# Patient Record
Sex: Female | Born: 1965 | Hispanic: Yes | Marital: Single | Smoking: Never smoker
Health system: Western US, Academic
[De-identification: ages and names within clinical notes are randomized; demographics above are authoritative.]

## PROBLEM LIST (undated history)

## (undated) DIAGNOSIS — I10 Essential (primary) hypertension: Secondary | ICD-10-CM

## (undated) MED ORDER — SILVER SULFADIAZINE 1 % EX CREA
1.00 | TOPICAL_CREAM | Freq: Once | CUTANEOUS | Status: AC | PRN
Start: 2013-11-30 — End: ?

---

## 2008-07-11 ENCOUNTER — Emergency Department: Payer: Self-pay

## 2008-07-11 ENCOUNTER — Other Ambulatory Visit (INDEPENDENT_AMBULATORY_CARE_PROVIDER_SITE_OTHER): Payer: Self-pay

## 2008-07-11 ENCOUNTER — Emergency Department (HOSPITAL_BASED_OUTPATIENT_CLINIC_OR_DEPARTMENT_OTHER): Admitting: Emergency Medicine

## 2008-07-11 ENCOUNTER — Other Ambulatory Visit (HOSPITAL_BASED_OUTPATIENT_CLINIC_OR_DEPARTMENT_OTHER): Payer: Self-pay

## 2008-07-12 LAB — ECG, COMPLETE (HC/~~LOC~~/ENCINITAS)
PR INTERVAL: 134 ms
QRS INTERVAL/DURATION: 76 ms
QT: 376 ms
QTc (Bazett): 457 ms
R AXIS: -1 degrees
VENTRICULAR RATE: 89 {beats}/min

## 2008-07-12 NOTE — ED Notes (Addendum)
 ============================== ADMIT SUMMARY ==============================    RECEIVING NURSE -   ED NURSE -     +------------------------------- ALLERGIES -------------------------------+   No known drug allergies (07/11/2008);     +-------------------------- ADMITTING DIAGNOSIS --------------------------+       +--------------------------- ADMITTING SERVICE ---------------------------+  ADMISSION SERVICE -   LEVEL OF CARE -   ATTENDING -   RESIDENT -     +------------------------ MOST RECENT VITAL SIGNS ------------------------+  BP - 128/80 PULSE - 72   RESPIRATIONS - 16 O2 SAT - 99   TEMPERATURE - 98.0 MODE - Oral   GCS TOTAL -   PAIN - PAIN QUALITY -   PAIN LOCATION -   DATE/TIME - 07/12/2008 0005    +-------------------------------- FLUIDS ---------------------------------+  DATE TIME IV FLUID L/R LOCATION SIZE HUNG ABSORBED  ---------------------------------------------------------------------------    12/21 1421 Normal Saline Left Anticubital 20 1000 ml 0 ml  12/21 1645 Normal Saline Left Anticubital 20 1000 ml 1000 ml  12/21 1645 NS with Med Left Anticubital 20 50 ml 0 ml  12/21 1714 NS with Med Left Anticubital 20 0 ml 50 ml  12/21 1853 Normal Saline Left Anticubital 20 0 ml 1000 ml   TOTAL IV: 2050 ml    TOTAL OUTPUT: 0 ml TOTAL PO: 200 ml    +------------------------------ MEDICATIONS ------------------------------+  DATE TIME MEDICATION VERIFYING RN RN INIT  ---------------------------------------------------------------------------    12/21 1407 Ibuprofen 600 MG PO ARC  12/21 1407 Ondansetron HCl 4 MG PO ARC  12/21 1501 Hydrocodone-Acetaminophen 1 Tabs ARC   PO-(TAB)  12/21 1646 Sodium Chloride 1000 Milliliters ARC   IV-(BOLUS)  12/21 1646 Morphine Sulfate 8 MG IV 50cc d5w ARC  12/21 1900 Ondansetron HCl 4 MG IV ARC  12/21 1900 Sodium Chloride 1000 Milliliters ARC   IV-(BOLUS)  12/21 2120 Oxycodone-Acetaminophen 2 Caps PO WBP    +------------------------------- LABS DONE  -------------------------------+  ACT- MD MD RN AP +INITIALS+  IVE DATE TIME TIME TIME TREATMENT ORDERS MD RN AP   ---------------------------------------------------------------------------     12/21 2207 2211 INFLUENZA A/B RAPID PANEL, acs [facility]    INFLUENZA-RESP VIRUS RAPID CULTURE   Collect New Specimen   Specimen Type: Nasal   Swab/Rhinoprobe    EKG DONE - YES    +---------------------------- PROCEDURE NOTES ----------------------------+      +------------------------ CURRENT MEDICATION --------------------------+     Tylenol for Continuous  +------------------------ OTHER NURSING PROCEDURES -----------------------+     +--------------------------- PSYCHOSOCIAL NEEDS --------------------------+  +------------------------- BARRIERS TO LEARNING --------------------------+    ASSESSMENT- Assessment Done with Findings of:      BARRIERS-    No Barriers  SUPPORT PERSON-      SPECIAL CONSIDERATIONS-      ============================== TRIAGE RECORD ==============================    CHIEF COMPLAINT- Cough    TIME OF ONSET- 2 weeks : +-STANDING-+ +--SEATED--+  TRIAGE CATEGORY- 2 : BP PULSE BP PULSE   ROOM- 4A : N/A/N/A N/A 149/92 90   MODE OF ARRIVAL- Car :   IN CUSTODY- No : TEMP MODE O2SAT RESP LMP   PRIVATE MD- N/A : 98.3 Oral 100 18 07/03/08       :   WORK RELATED INJURY- No : +--GCS--+ +--PUPILS--+  RETURN IN 72 HOURS- None : E V M TOT L R RESPONSE  TRIAGE NURSE- Pat Doyle : 4 5 6 15  X X N/A     IS THIS VISIT RELATED TO ASSAULT OR DOMESTIC VIOLENCE- No   PAIN TYPE- V NOW- 6 TOLERABLE AT- 4 QUALITY- Intermittent  PAIN LOCATION- Head 6/10;chest 5/10. RADIATES TO- N/A      LATEX ALLERGY FORM- N/A LATEX ALLERGY- N/A TETANUS- N/A   IMMUNIZATION- N/A PED HEIGHT- N/A WEIGHT- N/A KG  ADDITIONAL FORMS- N/A  +------------------------- CARE PRIOR TO ARRIVAL -------------------------+   Tylenol  +------------------------------- ALLERGIES -------------------------------+    MEDICATION ALLERGY  REACTION  ---------------------------------------------------------------------------    N/A No known drug allergies Unknown Rea    +------------------------------ MEDICATIONS ------------------------------+    MEDICATION NAME DOSAGE FREQUENCY  ---------------------------------------------------------------------------    Tylenol N/A N/A     +-------------------------- PAST MEDICAL HISTORY -------------------------+     +---------------------------- CURRENT HISTORY ----------------------------+   Pt. c/o cough,sore throat for 2 weeks. CP and HA since last night.since   last night. Taking po fluids.    =========================== REASSESSMENT VITALS ===========================   R T M ET    E E O CO2 PU-    S M D O2 ET TY- PIL +---GCS----+   DATE TIME BP HR P P E SAT CO2 PE L R E V M TOT POSITION      ---------------------------------------------------------------------------    12/21 1048 149/92 90 18 98.3 O 100 4 5 6 15  Seated   12/21 1048 / Standing  12/21 1427 118/79 87 16 100 Lying   12/21 1429 139/99 85 16 100 Seated   12/21 1431 147/109 91 16 100 Standing  12/21 1647 140/72 79 14 99   12/21 1747 /   12/21 1804 122/86 80 16 99 Lying   12/21 1914 / 97.6 O   12/21 2000 / 99.0 C   12/21 2239 129/84 77 18 99 Lying   12/22 0005 128/80 72 16 98.0 O 99 Lying      +-----------------PAIN-----------------+   T I    Y N T N    P O O I   DATE TIME E W L LOCATION QUALITY RADIATES COMMENT T   ---------------------------------------------------------------------------    12/21 1048 V 6 4 Head 6/10; Intermittent N/A Triage PLD  12/21 1048 Triage PLD  12/21 1427 Orthostat JEC  12/21 1429 Orthostat JEC  12/21 1431 Orthostat JEC  12/21 1647 V 8 4 head Constant none ARC  12/21 1747 V 8 4 head Constant none ARC  12/21 1804 JEC  12/21 1914 JEC  12/21 2000 WBP  12/21 2239 x   12/22 0005 WBP    ============================= NURSE DISCHARGE =============================    DISCHARGE NURSE- Wilma Pacal   DISPOSITION- Discharged from ED  WITH- Family   ACCOMPANIED BY- N/A   EQUIP W/TRANSPORT-    N/A   TIME OF DISPOSITION- 07/12/2008 0010 LEFT ED VIA- [facility]   TRANSFERRED TO- N/A REASON- N/A   ADMITTED TO- N/A ROOM- N/A   NURSE REPORT TO- N/A REPORT TIME- X N/A   BELONGINGS- N/A ENVELOPE NUMBER- N/A   CONDITION ON DISCHARGE- Stable   AFTERCARE PROVIDED WITH- Written and Verbal   WHAT AFTERCARE INSTRUCTIONS WERE GIVEN AND REVIEWED WITH PATIENT  AND/OR FAMILY?- (see EPIC instructions)   Viral Syndrome (cold, URI, upper respiratory infection); meds, pmd ff up  IN WHAT LANGUAGE WERE THESE GIVEN?- English OTHER: N/A   TRANSLATED BY- N/A OTHER: N/A   GCS- E: 4 V: 5 M: 6 TOTAL: 15   PAIN LEVEL UPON DISPOSITION- 2 OUT OF 10  WHAT MEDS WERE PROVIDED FROM DISCHARGE PYXIS?-    vicodin   RX TO BE FILLED FOR- Robitussin AC . motrin; Motrin Please review with MD.;     DID THE PATIENT OR RESPONSIBLE  CARE PROVIDER UNDERSTAND THE FOLLOW UP  RECOMMENDATION?- Yes DISPOSITION BY- MD   NURSING LEVEL- 3. ED Stay with Multiple Interactions     +------------------------------- RESTRAINTS ------------------------------+  ALTERNATIVE ATTEMPTS:    -------------------------- RESTRAINT ASSESSMENTS --------------------------  ============================== POINT OF CARE ==============================    OCCULT BLOOD STOOL RESULTS   Norm results neg.  DATE TIME RESULTS DONE BY CONTROL POSTIVE CONTROL NEGATIVE     URINE PREGNANCY TEST   Norm results for non pregnant females neg.  DATE TIME RESULTS DONE BY CONTROL POSTIVE   12/21 1221 Neg ARC Yes     URINE DIP   Norm results - All neg. with pH 5.0 to 8.0 and urobili 0.2 to 1.0   LEUKO NI- PRO- GLU- URO-   DATE TIME CYTE TRITE PH TEIN COSE KETONES BILI BILI BLOOD BY   12/21 1221 neg neg 7.0 neg neg neg .2 neg neg ARC    FINGER STICK GLUCOSE   Norm results 65 to 110 mg/dl  DATE TIME RESULTS DONE BY     FINGER STICK HEMOGLOBIN   Norm results adult female 60 to 17 gm/dl  Norm results adult female 19 to 16 gm/dl  DATE TIME RESULTS DONE  BY     ============================== MD NOTES H&P ===============================    TIME OF NOTE WRITTEN- N/A   CHIEF COMPLAINT- Cough     HISTORY OF PRESENT ILLNESS  12/21 1624 Kermit Balo, MD     42 yo f c/o 1 wk h/o procductive cough that recenlty got

## 2008-07-12 NOTE — ED Notes (Addendum)
==============================   ATTENDING NOTE =============================    12/22 0007 Brandy Browns, MD Attending     pt seen and examined hx pe management discussed with   resident   42 yr old female initially signed out to me but later since   sx not improving examined pt prev healthy presented with   nonproductive cough sore throat runny nose myalgias and   headache latter described as slowly worsening at worst 8/10   intensity not sudden onset no vomiting no photophobia    alert female well appearing   supple neck   perrl bilaterally   oroph clear   no tonsillar exudate   cta bilaterally   rrr no murmur   soft no ttp   no rash   oriented   cn 2-7 intact bilaterally   no pronator drift   nl ftn bilaterally   nl gait      cxry nad   ct brain nad no sinusitis      i/p   uri   -suspect influenza pt was txed symptomatically and will tx   with ostelmavir pt improved sign here with sx tx agree with   dc

## 2008-07-12 NOTE — ED Notes (Addendum)
 worse. +body aches, f/c, Ha, sore throat, sinus congestion.   +plueritic cp on right side, pain reproducable to palp.   +sick contact w son dx w flu. +n no emesis. +sob. no abd   pain. no neck stiffness. no LE edema, de appetite      PAST MEDICAL/SURGICAL HISTORY   N/A     FAMILY HISTORY- N/A   SOCIAL HISTORY-   SMOKING ALCOHOL ILLICIT DRUGS HOMELESS MARRIED EMPLOYED   NONE NONE NONE N/A S   UNEMPLOYED  OTHER- N/A   REVIEW OF SYSTEMS- N/A     PHYSICAL EXAM  12/21 1624 Kermit Balo, MD     Vital signs reviewed and noted to be WNL. afebrile   GEN: Pt is WDWN in NAD. A&O*3.    HEENT: perrl, eomi. mmm. OP mild erythema no exudates   CHEST: CTA B.    CARD: RRR no murmur.    ABD: + BS soft mild epigastic tenderness   EXT: No pedal edema, cap refill wnl.   SKIN: Warm, not cool or diaphoretic, no rashes   NEURO: Mentation and speech fluent/appropriate. nonfocal      IMPRESSION  12/21 1624 Kermit Balo, MD     42 yo f w flu sx, poss PNA vs influenza      MEDICAL DECISION MAKING  12/21 1624 Kermit Balo, MD     cxr, ivfs, pain control    CASE PRESENTED TO- N/A     ============================= PHYSICIAN NOTES =============================    12/21 1332 Kermit Balo, MD     CXR: NAD, no consoldation, PTX    12/21 1656 Marcelle Smiling, MD Attending     sx control & dc w/ tamiflu    12/21 1708 Chancy Milroy, MD     bromely signout-       41 yo prev healthy female, with uri, cough, cxr neg,   recieving IVF/morphine, reassess/eval, will write for   robitussin, tamiflu, and motrin for dc. Anticipate dc home   after reassess.    12/21 2259 Chancy Milroy, MD     pt w/ continued headache slow onset x 2 days, in setting of   flu symptoms/uri, pt's exam, history and physical, nml neuor   exam, headache frontal in nature, no photophobia or   meningismus, doubt meningitis or ICH, will check CT head   for obvious signs of sinusitis that would require abx    12/21 2348 Chancy Milroy, MD     reassessed, pt headache  improved, feeling better, CT head   neg rads prelim, will plan for dc home with script for   vicodin #15      ============================= PROCEDURE NOTES =============================      ================================ LAB NOTES ================================    12/21 1243 Kermit Balo, MD     Upreg Neg   Udip neg    12/21 2255 Chancy Milroy, MD     flu neg      ================================= IMAGES ==================================      ============================== MD DISCHARGE ===============================    DISCHARGE PHYSICIAN- Nicholle Bromley   CHIEF COMPLAINT- Cough CASE PRESENTED TO- Colleen Buono   CONDITION OF DISCHARGE- Stable   WAS THIS VISIT FOR A WORK RELATED ILLNESS OR INJURY- No   PRIMARY CARE PHYSICIAN- NO PCP PER PATIENT   HAS PCP BEEN CONTACTED- N/A H&P NOTE WAS DICTATED- No     +-------------------------- DISCHARGE DIAGNOSIS --------------------------+   786.2 COUGH     +------------------------- DISCHARGE INSTRUCTIONS ------------------------+    12/21 1648 Kermit Balo, MD  PHYSICIAN- Hiram Comber, MD Attending FOLLOW-UP(DAYS)- N/A    APPOINTMENT- N/A RETURN TO- N/A LANGUAGE- English    INSTRUCTIONS-   MEDICATIONS-   REFERRAL CLINICS-   [facility]    CC [facility] ([facility]    CC [facility] [address]    [facility] ([place]    CC [facility] ([place])    CC [facility] [address] [facility] ([place])    CC [facility] ([place])    CC [facility] ([place])    REFERRAL PHYSICIANS-   ADDITIONAL INSTRUCTIONS-   N/A       +----------------------- MEDICATION RECONCILIATION -----------------------+    ---------------------------- CURRENT MEDICATION ---------------------------  MEDICATION NAME DOSAGE FREQUENCY  ---------------------------------------------------------------------------    Tylenol N/A N/A          ---------------------------- STOPPED MEDICATION ---------------------------  MEDICATION NAME DOSAGE FREQUENCY  ---------------------------------------------------------------------------      ---------------------------- UPDATED MEDICATION ---------------------------  MEDICATION NAME DOSAGE FREQUENCY  ---------------------------------------------------------------------------      ----------------------------- ADDED MEDICATION ----------------------------  MEDICATION NAME DOSAGE FREQUENCY  ---------------------------------------------------------------------------    Robitussin AC Please review with MD. 10 ML Every 4 Hours   Motrin Please review with MD. 1 MG When Necessary      ============================= FOLLOW UP NOTES =============================

## 2008-07-12 NOTE — ED Notes (Addendum)
=================================   ORDERS ==================================    ACT- MD MD RN AP   IVE DATE TIME TIME TIME TREATMENT ORDERS MD RN   ---------------------------------------------------------------------------     12/21 1059 1103 CHEST PA & LATERAL, X-RAY NB ARC    Rad Indication: cough x 2wks   12/21 1100 1103 EKG NB ARC    12/21 1211 1214 POC Urine Dip NB ARC    12/21 1211 1214 POC UPT NB ARC    12/21 1355 1357 Ibuprofen 600 MG PO - Oral Please CJB ARC    review with MD.   12/21 1356 1357 Ondansetron HCl 4 MG PO - Oral CJB ARC    Please review with MD.   12/21 1401 1402 orthostats CJB ARC    12/21 1401 1402 po trial w/ 1L fluid please CJB ARC    12/21 1450 1452 Hydrocodone-Acetaminophen 1 Tabs NB ARC    PO-(TAB) Please review with MD.   12/21 1611 1649 Sodium Chloride 1000 Milliliters NB ARC    IV-(BOLUS)   12/21 1611 1649 Morphine Sulfate 8 MG IV - NB ARC    Intravenous   12/21 1849 1851 Ondansetron HCl 4 MG IV - acs ARC    Intravenous Please review with MD.   12/21 1858 1900 Sodium Chloride 1000 Milliliters acs ARC    IV-(BOLUS)   12/21 1858 1900 please repeat core temp acs ARC    12/21 2116 2130 Oxycodone-Acetaminophen 2 Caps PO - acs WBP    Oral Please review with MD.   12/21 2206 2210 CT HEAD W/O CONTRAST acs [facility]    Rad Indication: headache   12/21 2207 2211 INFLUENZA A/B RAPID PANEL, acs [facility]    INFLUENZA-RESP VIRUS RAPID CULTURE   Collect New Specimen   Specimen Type: Nasal   Swab/Rhinoprobe   12/21 2349 0006 Discontinue IV Line As Indicated acs WBP    12/21 2349 0006 Discharge acs WBP

## 2008-12-15 ENCOUNTER — Other Ambulatory Visit (INDEPENDENT_AMBULATORY_CARE_PROVIDER_SITE_OTHER): Payer: Self-pay | Admitting: Emergency Medicine

## 2008-12-15 ENCOUNTER — Emergency Department
Admit: 2008-12-15 | Discharge: 2008-12-18 | Disposition: A | Discharge status: Home Health | Payer: Self-pay | Attending: Emergency Medicine | Admitting: Emergency Medicine

## 2008-12-15 LAB — CBC WITH DIFF, BLOOD
Basophils: 0 % (ref 0–2)
Eosinophils: 1 % (ref 1–3)
Hct: 39.7 % (ref 36.0–46.0)
Hgb: 13.6 g/dL (ref 12.0–16.0)
Lymphocytes: 33 % (ref 20–40)
MCHC: 34.2 % (ref 32–37)
MCV: 88.2 um3 (ref 82.0–98.0)
Monocytes: 5 % (ref 1–10)
Plt Count: 344 10*3/uL (ref 130–400)
RBC: 4.49 10*6/uL (ref 4.00–5.00)
RDW: 11.8 % (ref 10–15)
WBC: 8.9 10*3/uL (ref 4.0–11.0)

## 2008-12-15 LAB — URINALYSIS
Glucose: NEGATIVE
Ketones: NEGATIVE
Specific Gravity: 1.022 (ref 1.002–1.030)
pH: 6 (ref 5.0–8.0)

## 2008-12-15 LAB — BASIC METABOLIC PANEL, BLOOD
BUN: 11 mg/dL (ref 6–20)
Calcium: 9.2 mg/dL (ref 8.6–10.5)
Chloride: 103 mmol/L (ref 98–107)
Creatinine: 0.6 mg/dL (ref 0.51–0.95)
GFR (African Amer.): >60 mL/min
GFR: >60 mL/min
Glucose: 94 mg/dL (ref ?–115)
Potassium: 3.5 mmol/L (ref 3.5–5.1)
Sodium: 140 mmol/L (ref 136–145)

## 2008-12-15 LAB — LIVER PANEL, BLOOD
ALT (SGPT): 13 U/L
AST (SGOT): 14 U/L
Albumin: 4.4 g/dL (ref 3.5–5.2)
Alkaline Phos: 68 U/L
Bilirubin, Dir: 0.1 mg/dL (ref <0.2)
Bilirubin, Tot: 0.3 mg/dL (ref <1.2)

## 2008-12-15 LAB — LIPASE, BLOOD: Lipase: 53 U/L (ref ?–60)

## 2008-12-16 NOTE — ED Notes (Signed)
 FAMILY HISTORY- nc   SOCIAL HISTORY-   SMOKING ALCOHOL ILLICIT DRUGS HOMELESS MARRIED EMPLOYED   N/A N/A N/A NO S   UNEMPLOYED  OTHER- nc   REVIEW OF SYSTEMS- All other systems reviewed and are negative.     PHYSICAL EXAM  05/27 2306 Brandy Lerner, MD     Vital signs reviewed and noted.    GEN: a&o in nad, tearful, anxious, non-toxic appearing.   HEAD: nc/at. perrl, eomi. mmm.    NECK: Supple, no LAD, No JVD. Nl thyroid   LUNGS: CTA B. No wheeze/rales/rhonchi   HEART: RRR, no m/r/g.    ABD: Soft, ND, NT   BACK: no CVA tenderness   EXT: No pedal edema   SKIN: Warm, dry, no rashes   NEURO: Mentation appropriate, CN II-XII intact. 5/5 strength   B/L U&LE. Sensation globally intact and symmetric. Gait   normal.    05/27 2333 Brandy Lerner, MD     PELVIC: Done w/ ED RN present. nml ext F genitalia. Speculum   nl closed os, no lacerations noted, Minimal white   discharge. Bimanual diffuse tenderness, nl sized uterus,   no masses palpated.      IMPRESSION  05/27 2306 Brandy Lerner, MD     [phone] f with stress at home, multiple complaints including   ha, back pain, lower abdomianl pain      MEDICAL DECISION MAKING  05/27 2306 Brandy Lerner, MD     will check labs, ua, pelvic. Do not feel imaging is needed   at this time given normal neuro exam. will give analgesia   and re assess.    CASE PRESENTED TO- N/A     ============================= PHYSICIAN NOTES =============================    05/28 0144 Pauletta Browns, MD Attending     pt with nausea vomiting after azithromycin will given   antiemetic plan on dc    05/28 0210 Pauletta Browns, MD Attending     pt feeling better will dc home      ============================= PROCEDURE NOTES =============================      ================================ LAB NOTES ================================    05/27 2114 Quin Hoop, MD Attending     ua negative    05/27 2149 Brandy Lerner, MD     no leukocytosis   no anemia    05/27 2205 Brandy Lerner, MD     bmp wnl   lfts  wnl    05/28 0127 Pauletta Browns, MD Attending     seen      ================================= IMAGES ==================================      ============================== MD DISCHARGE ===============================    DISCHARGE PHYSICIAN- Brandy Martin   CHIEF COMPLAINT- Back Pain CASE PRESENTED TO- Pauletta Browns   CONDITION OF DISCHARGE- Improving   WAS THIS VISIT FOR A WORK RELATED ILLNESS OR INJURY- No   PRIMARY CARE PHYSICIAN- NO PCP PER PATIENT   HAS PCP BEEN CONTACTED- N/A H&P NOTE WAS DICTATED- No     +-------------------------- DISCHARGE DIAGNOSIS --------------------------+   724.5 BACKACHE UNSPECIFIED     +------------------------- DISCHARGE INSTRUCTIONS ------------------------+    05/27 2346 Brandy Lerner, MD     PHYSICIAN- Pauletta Browns, MD Attending FOLLOW-UP(DAYS)- N/A    APPOINTMENT- N/A RETURN TO- N/A LANGUAGE- English    INSTRUCTIONS-   MEDICATIONS-   REFERRAL CLINICS-   [facility]    CC [facility] ([facility]    CC [facility] [address]    CC [facility]  CC [facility] ([place]    [facility]    [facility] ([place])    [facility]    CC [facility] [address] [facility] ([place]    CC [facility])    CC [facility] ([place]    REFERRAL PHYSICIANS-   ADDITIONAL INSTRUCTIONS-   N/A       +----------------------- MEDICATION RECONCILIATION -----------------------+    ---------------------------- CURRENT MEDICATION ---------------------------  MEDICATION NAME DOSAGE FREQUENCY  ---------------------------------------------------------------------------      ---------------------------- STOPPED MEDICATION ---------------------------  MEDICATION NAME DOSAGE FREQUENCY  ---------------------------------------------------------------------------    NO MEDICATIONS N/A N/A      ---------------------------- UPDATED MEDICATION ---------------------------  MEDICATION NAME DOSAGE FREQUENCY  ---------------------------------------------------------------------------      ----------------------------- ADDED MEDICATION ----------------------------  MEDICATION NAME DOSAGE FREQUENCY  ---------------------------------------------------------------------------    Vicodin 1 MG When Necessary      ============================= FOLLOW UP NOTES =============================

## 2008-12-16 NOTE — ED Notes (Signed)
=================================   ORDERS ==================================    ACT- MD MD RN AP   IVE DATE TIME TIME TIME TREATMENT ORDERS MD RN   ---------------------------------------------------------------------------     05/27 1832 1905 CLEAN CATCH URINALYSIS COMPLETE CC md    Collect New Specimen   Specimen Type: Urine   05/27 1832 1905 POC UPT CC md    05/27 2128 2131 CBC WITH DIFF** tab cmo    Collect New Specimen   Specimen Type: Blood   05/27 2128 2131 CHEM 7 + [place](BASIC META PANEL) tab cmo    Collect New Specimen   Specimen Type: Blood   05/27 2129 2131 LIPASE, LIVER PANEL tab cmo    Collect New Specimen   Specimen Type: Blood   05/27 2133 2139 please repeat blood pressure AS cmo    05/27 2303 2304 Ketorolac Tromethamine 15 MG IV - tab cmo    Intravenous   05/27 2303 2304 Hydrocodone-Acetaminophen 2 Tabs tab cmo    PO-(5/500 TAB)   05/27 2332 2333 CULTURE,GC, FUNGAL SMEAR KOH PREP., tab cmo    CHLAMYDIA/GC PCR-GENITAL, WET MOUNT   Collect New Specimen   Specimen Type: Genital   05/27 2334 2336 Ceftriaxone Sodium 250 MG IM - tab cmo    Intramuscular   05/27 2334 2336 Azithromycin 1000 MG PO-(TAB) tab cmo    05/27 2347 0141 Discontinue IV Line As Indicated tab cmo    05/27 2347 0141 Discharge tab cmo    05/28 0122 0125 ZOFRAN 4 MG IV VORB-MD ZELONIS/RN baz cmo    C.OWENBY-NAUSEA   05/28 0144 0155 Prochlorperazine Edisylate 10 MG IV AS cmo    - Intravenous

## 2008-12-16 NOTE — ED Notes (Signed)
==============================   ATTENDING NOTE =============================    05/28 0127 Brandy Browns, MD Attending     pt seen and examined hx pe management discussed with   resident   43 yr old female presents with predominantly 2 complaints   1) lower abd pain and vaginal bleeding told hx of cyst pain   not severe states been using 9 pads per day for long time no   urinary sx no vomiting   2) headache in hatband distribution atraumatic no visual   motor sensory changes no hx of cancer no hiv rfactors felt   warm night before no cohabitants with similar sx lives with   husband and children admittedly under much stress lately   denies suicidal ideation or physical abuse many positive sx   of depression   review of pcis has had imagin brain 07/11/08 for headache   nl      alert well appearing   supple neck   perrl bilaterally   cta bilaterally   rrr   very mild ttp bilateral lower abdomen no percussion ttp no   hsm no masses   no cvat   pelvic per resident possible mild dmt no pus dc   oriented   cn 2-7 intact bilaterally   5/5 bilat u and lower ext   nl ftn bilaterally   nl gait      upt negative   ua nl wbc nl      i/p   headache   abdominal pain   -given hx suspect tension headache as etiology of sx will tx   with meds not unreasonable to tx for cervicitis outpt fup   return prn

## 2008-12-16 NOTE — ED Notes (Signed)
============================== ADMIT SUMMARY ==============================    RECEIVING NURSE -   ED NURSE -     +------------------------------- ALLERGIES -------------------------------+   No known drug allergies (07/11/2008);     +-------------------------- ADMITTING DIAGNOSIS --------------------------+       +--------------------------- ADMITTING SERVICE ---------------------------+  ADMISSION SERVICE -   LEVEL OF CARE -   ATTENDING -   RESIDENT -     +------------------------ MOST RECENT VITAL SIGNS ------------------------+  BP - PULSE -   RESPIRATIONS - O2 SAT -   TEMPERATURE - MODE -   GCS TOTAL -   PAIN - PAIN QUALITY -   PAIN LOCATION -   DATE/TIME - 12/16/2008 0157    +-------------------------------- FLUIDS ---------------------------------+  DATE TIME IV FLUID L/R LOCATION SIZE HUNG ABSORBED  ---------------------------------------------------------------------------     TOTAL IV: 0 ml    TOTAL OUTPUT: 0 ml TOTAL PO: 0 ml    +------------------------------ MEDICATIONS ------------------------------+  DATE TIME MEDICATION VERIFYING RN RN INIT  ---------------------------------------------------------------------------    05/27 2330 Ketorolac Tromethamine 15 MG IV cmo  05/27 2330 Hydrocodone-Acetaminophen 2 Tabs PO cmo  05/28 0020 Ceftriaxone Sodium 250 MG IM R quad cmo  05/28 0020 Azithromycin 1000 MG PO-(TAB) cmo  05/28 0115 ZOFRAN 4 MG IV cmo  05/28 0200 Prochlorperazine Edisylate 10 MG IV cmo    +------------------------------- LABS DONE -------------------------------+  ACT- MD MD RN AP +INITIALS+  IVE DATE TIME TIME TIME TREATMENT ORDERS MD RN AP   ---------------------------------------------------------------------------     05/27 1832 1905 CLEAN CATCH URINALYSIS COMPLETE CC md    Collect New Specimen   Specimen Type: Urine   05/27 2128 2131 CBC WITH DIFF** tab cmo    Collect New Specimen   Specimen Type: Blood   05/27 2128 2131 CHEM 7 + CA(BASIC META PANEL) tab cmo    Collect New Specimen    Specimen Type: Blood   05/27 2129 2131 LIPASE, LIVER PANEL tab cmo    Collect New Specimen   Specimen Type: Blood   05/27 2332 2333 CULTURE,GC, FUNGAL SMEAR KOH PREP., tab cmo    CHLAMYDIA/GC PCR-GENITAL, WET MOUNT   Collect New Specimen   Specimen Type: Genital    EKG DONE - NO    +---------------------------- PROCEDURE NOTES ----------------------------+      +------------------------ CURRENT MEDICATION --------------------------+     NO MEDICATIONS   +------------------------ OTHER NURSING PROCEDURES -----------------------+     +--------------------------- PSYCHOSOCIAL NEEDS --------------------------+  +------------------------- BARRIERS TO LEARNING --------------------------+    ASSESSMENT- Assessment Done with Findings of:      BARRIERS-    Pain  SUPPORT PERSON- mother      SPECIAL CONSIDERATIONS-    HA/back pain/bilat lower quad abd pain  ============================== TRIAGE RECORD ==============================    CHIEF COMPLAINT- Back Pain    TIME OF ONSET- N/A : +-STANDING-+ +--SEATED--+  TRIAGE CATEGORY- 2 : BP PULSE BP PULSE   ROOM- 8B : N/A/N/A N/A 162/107 81   MODE OF ARRIVAL- Bus :   IN CUSTODY- No : TEMP MODE O2SAT RESP LMP   PRIVATE MD- N/A : 98.2 Oral 100 18 11/10/08       :   WORK RELATED INJURY- No : +--GCS--+ +--PUPILS--+  RETURN IN 72 HOURS- None : E V M TOT L R RESPONSE  TRIAGE NURSE- Mary Sorenson : 4 5 6 15  X X N/A     IS THIS VISIT RELATED TO ASSAULT OR DOMESTIC VIOLENCE- no   PAIN TYPE- V NOW- 6 TOLERABLE AT- 4 QUALITY- Constant  PAIN LOCATION- back RADIATES TO- none   LATEX ALLERGY FORM- N/A LATEX ALLERGY- N/A TETANUS- N/A   IMMUNIZATION- UTD PED HEIGHT- N/A WEIGHT- N/A KG  ADDITIONAL FORMS- N/A  +------------------------- CARE PRIOR TO ARRIVAL -------------------------+   none  +------------------------------- ALLERGIES -------------------------------+    MEDICATION ALLERGY REACTION  ---------------------------------------------------------------------------    N/A No known drug  allergies Unknown Rea    +------------------------------ MEDICATIONS ------------------------------+    MEDICATION NAME DOSAGE FREQUENCY  ---------------------------------------------------------------------------    NO MEDICATIONS N/A N/A     +-------------------------- PAST MEDICAL HISTORY -------------------------+     +---------------------------- CURRENT HISTORY ----------------------------+   Has had menses for a month, stopped monday.States hasn't had a PAP smear   in several years. Back pain, pain with urination, fever, headache.     =========================== REASSESSMENT VITALS ===========================   R T M ET    E E O CO2 PU-    S M D O2 ET TY- PIL +---GCS----+   DATE TIME BP HR P P E SAT CO2 PE L R E V M TOT POSITION      ---------------------------------------------------------------------------    05/27 1828 162/107 81 18 98.2 O 100 4 5 6 15  Seated   05/27 1828 / Standing  05/27 2144 135/87   05/28 0157 / Lying      +-----------------PAIN-----------------+   T I    Y N T N    P O O I   DATE TIME E W L LOCATION QUALITY RADIATES COMMENT T   ---------------------------------------------------------------------------    05/27 1828 V 6 4 back Constant none Triage MSS  05/27 1828 Triage MSS  05/27 2144 cmo  05/28 0157 JMP    ============================= NURSE DISCHARGE =============================    DISCHARGE NURSE- Corri Owenby   DISPOSITION- Discharged from ED WITH- Family   ACCOMPANIED BY- N/A   EQUIP W/TRANSPORT-    N/A   TIME OF DISPOSITION- 12/16/2008 0222 LEFT ED VIA- [facility]   TRANSFERRED TO- N/A REASON- N/A   ADMITTED TO- N/A ROOM- N/A   NURSE REPORT TO- N/A REPORT TIME- X N/A   BELONGINGS- N/A ENVELOPE NUMBER- N/A   CONDITION ON DISCHARGE- Improving   AFTERCARE PROVIDED WITH- Written and Verbal   WHAT AFTERCARE INSTRUCTIONS WERE GIVEN AND REVIEWED WITH PATIENT  AND/OR FAMILY?- (see EPIC instructions)   Abdominal Pain - F/U 24 Hours (abdominal pain of unknown etiology);    Headache  (cephalgia); Cervicitis (STD, sexually transmitted disease);   IN WHAT LANGUAGE WERE THESE GIVEN?- English OTHER: N/A   TRANSLATED BY- N/A OTHER: N/A   GCS- E: 4 V: 5 M: 6 TOTAL: 15   PAIN LEVEL UPON DISPOSITION- 0 OUT OF 10  WHAT MEDS WERE PROVIDED FROM DISCHARGE PYXIS?-    none   RX TO BE FILLED FOR- Vicodin;      DID THE PATIENT OR RESPONSIBLE CARE PROVIDER UNDERSTAND THE FOLLOW UP  RECOMMENDATION?- Yes DISPOSITION BY- RN   NURSING LEVEL- 3. ED Stay with Multiple Interactions     +------------------------------- RESTRAINTS ------------------------------+  ALTERNATIVE ATTEMPTS:    -------------------------- RESTRAINT ASSESSMENTS --------------------------  ============================== POINT OF CARE ==============================    OCCULT BLOOD STOOL RESULTS   Norm results neg.  DATE TIME RESULTS DONE BY CONTROL POSTIVE CONTROL NEGATIVE     URINE PREGNANCY TEST   Norm results for non pregnant females neg.  DATE TIME RESULTS DONE BY CONTROL POSTIVE   05/27 1941 Neg md Yes     URINE DIP   Norm results - All neg. with pH 5.0  to 8.0 and urobili 0.2 to 1.0   LEUKO NI- PRO- GLU- URO-   DATE TIME CYTE TRITE PH TEIN COSE KETONES BILI BILI BLOOD BY     FINGER STICK GLUCOSE   Norm results 65 to 110 mg/dl  DATE TIME RESULTS DONE BY     FINGER STICK HEMOGLOBIN   Norm results adult female 24 to 17 gm/dl  Norm results adult female 16 to 16 gm/dl  DATE TIME RESULTS DONE BY     ============================== MD NOTES H&P ===============================    TIME OF NOTE WRITTEN- N/A   CHIEF COMPLAINT- Back Pain     HISTORY OF PRESENT ILLNESS  05/27 2306 Brandy Lerner, MD     Patient is a 43yo f presents with multiple medical   complaints. Patient states shes had menstral bleeding for 1   mo. This stopped on Monday however she noted onset of   headache on Monday, slow in onset, not worst in life. Also   noted some mild abdominal discomfort reminiscent of when she   had an ovarian cyst diagnosed at another er 1-2 months ago.   Also  states dysuria and back pain, no trauma, falls or   accidents. Denies fevers, vomiting, chest pain, or other   complaints. Patient with severe increased stress at home,   pos for depression, neg for si or other thoughts of hurting   herself.      PAST MEDICAL/SURGICAL HISTORY   N/A

## 2008-12-18 LAB — GONOCOCCUS CULTURE

## 2011-05-10 ENCOUNTER — Encounter (INDEPENDENT_AMBULATORY_CARE_PROVIDER_SITE_OTHER): Payer: Self-pay

## 2012-03-10 ENCOUNTER — Other Ambulatory Visit (INDEPENDENT_AMBULATORY_CARE_PROVIDER_SITE_OTHER): Payer: Self-pay | Admitting: Family

## 2012-03-19 ENCOUNTER — Ambulatory Visit (HOSPITAL_BASED_OUTPATIENT_CLINIC_OR_DEPARTMENT_OTHER)
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
## 2012-03-26 ENCOUNTER — Ambulatory Visit (HOSPITAL_BASED_OUTPATIENT_CLINIC_OR_DEPARTMENT_OTHER)

## 2012-03-26 ENCOUNTER — Ambulatory Visit (HOSPITAL_COMMUNITY): Payer: Self-pay

## 2012-03-26 NOTE — Interdisciplinary (Signed)
CLINIC:Phys Therapy    REPORT TYPE:TX NOTE    Dictating Practitioner: Jerrel Ivory. Garner Nash, PTA      DATE OF SERVICE: 03/26/2012    REASON FOR VISIT:Follow-Up Visit    Time Treatment Started: 02:30 P  Time Treatment Ended: 03:15 P    Subjective:  Pt  reports  3/10  R  knee  and  ankle  w/ weightbearing  mostly.  Pt  had  R  knee  and  R  ankle  aced  wrapped(issued  by  Dr.)Pt  also  wearing  cast  shoe  on  R  foot.    Pain: 3    Objective:  Observation:Pt  ambulating  slowly  w/  mild  antalgic  gait  w/o  a.d.  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    Todays  treatment  x 45  mins  Manual  therapy/Joint  mobilization/Theraputic  exercies/Gait  training/Home  exercises:  AROM  R  knee  0   to  128  degrees  of  flexion  Manual  therpay  AAROM  R  knee  ROM  Gentle  Patela  mobs  all  plane  R  knee  Attempted  quad  sets    x 5-pt  stopped  to  painful  AA  SLR  at  first  then  pt  able  to  perform  x 10  on  her  own    Manual  R  ankle  :  Gentle  DF/PF  Mobs  Gentle  PROM  PNF  AROM  ankle  DF/PF  Pt  started  seated  alphabet  exercises  R  ankle  AROM  R  ankle;  DF=5  PF=20  Inv=10  Ev=3//4  Reisted   ex R  Inversion using  Trigg  theraband    Ice  to  R  knee  and  ankle  elevated  x 12  mins    Assessment:  All  R  knee  and  R ankle  ROM  guarded  and  pt   moves  slowly  due  to  pain..Pt  needing some  verbal  cues  for  correct  technique,Fair  response  to  treatment.    Plan:  Continue  as  POC.Progress  as  able.                      Reviewed & Electronically Signed by:  Jerrel Ivory Garner Nash, PTA 03/26/2012 05:16 P  Electronically signed by:  Moody Bruins, P.T. 03/30/2012 09:45 A  DD: 03/26/2012    DT:  03/26/2012 04:53 P  DocNo.:  [id number]  LPD/        Referring Physician:  Sabino Gasser MD  [address]  [place] [address]        Primary Care Physician:  NO PCP PER PATIENT  [address] [place], [place] [address]      cc:

## 2012-04-01 ENCOUNTER — Encounter (HOSPITAL_BASED_OUTPATIENT_CLINIC_OR_DEPARTMENT_OTHER)

## 2012-04-01 NOTE — Interdisciplinary (Signed)
CLINIC:Rehabilitation Services    REPORT TYPE:TX NOTE    Dictating Practitioner: Jerrel Ivory. Garner Nash, PTA      DATE OF SERVICE: 04/01/2012    REASON FOR VISIT:No Show    The patient was a no show for the scheduled clinic appointment today.                      Reviewed & Electronically Signed by:  Jerrel Ivory Garner Nash, PTA 04/01/2012 05:08 P  Electronically signed by:  Moody Bruins, P.T. 04/01/2012 05:41 P  DD: 04/01/2012    DT:  04/01/2012 05:02 P  DocNo.:  [id number]  LPD/        Referring Physician:  Sabino Gasser MD  [address]  [place] [address]        Primary Care Physician:  NO PCP PER PATIENT  [address] [place], [place] [address]      cc:

## 2012-04-08 ENCOUNTER — Ambulatory Visit (HOSPITAL_BASED_OUTPATIENT_CLINIC_OR_DEPARTMENT_OTHER): Payer: Self-pay

## 2012-04-08 ENCOUNTER — Encounter (HOSPITAL_BASED_OUTPATIENT_CLINIC_OR_DEPARTMENT_OTHER)

## 2012-04-08 NOTE — Interdisciplinary (Signed)
CLINIC:Rehabilitation Services    REPORT TYPE:TX NOTE    Dictating Practitioner: Moody Bruins, P.T.      DATE OF SERVICE: 04/08/2012    REASON FOR VISIT:No Show    The patient was a no show for the scheduled clinic appointment today.                      Electronically signed by:  Moody Bruins, P.T. 04/08/2012 04:00 P        DD: 04/08/2012    DT:  04/08/2012 03:58 P  DocNo.:  [id number]  TA/        Referring Physician:  Sabino Gasser MD  [address]  [place] [address]        Primary Care Physician:  NO PCP PER PATIENT  [address] [place], [place] [address]      cc:

## 2012-04-10 ENCOUNTER — Encounter (HOSPITAL_BASED_OUTPATIENT_CLINIC_OR_DEPARTMENT_OTHER)

## 2012-04-10 ENCOUNTER — Ambulatory Visit (HOSPITAL_BASED_OUTPATIENT_CLINIC_OR_DEPARTMENT_OTHER): Payer: Self-pay

## 2012-04-10 NOTE — Interdisciplinary (Signed)
CLINIC:Rehabilitation Services    REPORT TYPE:TX NOTE    Dictating Practitioner: Jerrel Ivory. Garner Nash, PTA      DATE OF SERVICE: 04/10/2012    REASON FOR VISIT:No Show    The patient was a no show for the scheduled clinic appointment today.                      Reviewed & Electronically Signed by:  Jerrel Ivory Garner Nash, PTA 04/10/2012 04:35 P  Electronically signed by:  Moody Bruins, P.T. 04/10/2012 05:00 P  DD: 04/10/2012    DT:  04/10/2012 04:34 P  DocNo.:  [id number]  LPD/        Referring Physician:  Sabino Gasser MD  [address]  [place] [address]        Primary Care Physician:  NO PCP PER PATIENT  [address] [place], [place] [address]      cc:

## 2012-04-13 ENCOUNTER — Ambulatory Visit (HOSPITAL_BASED_OUTPATIENT_CLINIC_OR_DEPARTMENT_OTHER): Payer: Self-pay

## 2012-04-13 ENCOUNTER — Encounter (HOSPITAL_BASED_OUTPATIENT_CLINIC_OR_DEPARTMENT_OTHER)

## 2012-04-13 NOTE — Interdisciplinary (Signed)
CLINIC:Rehabilitation Services    REPORT TYPE:DC    Dictating Practitioner: Moody Bruins, P.T.      DATE OF SERVICE: 04/13/2012    REASON FOR VISIT:Discharge Summary    Date of Discharge: 04/13/2012    Date Tx Started: 03/19/2012    Number of Visits Seen: 2    Reason For Discharge:  This patient has been discharged due to the following reason/reasons:  multiple no shows and patient failed to make further appts      Goals:  unable to determine goal status, no formal assessment done  see last PT note on Sept 5, 2013    Plan:  discharge to MD and discharge with HEP                Electronically signed by:  Moody Bruins, P.T. 04/13/2012 03:42 P        DD: 04/13/2012    DT:  04/13/2012 03:40 P  DocNo.:  [id number]  TA/        Referring Physician:  Sabino Gasser MD  [address]  [place] [address]        Primary Care Physician:  NO PCP PER PATIENT  [address] [place], [place] [address]      cc:

## 2012-04-13 NOTE — Interdisciplinary (Unsigned)
CLINIC:Rehabilitation Services    REPORT TYPE:DC    Dictating Practitioner: Moody Bruins, P.T.      DATE OF SERVICE: 04/13/2012    REASON FOR VISIT:Discharge Summary    Date of Discharge: 04/13/2012    Date Tx Started: 03/19/2012    Number of Visits Seen: 2    Reason For Discharge:  This patient has been discharged due to the following reason/reasons:  multiple no shows and patient failed to make further appts      Goals:  unable to determine goal status, no formal assessment done  see last PT note on Sept 5, 2013    Plan:  discharge to MD and discharge with HEP                  Unreviewed        DD: 04/13/2012    DT:  04/13/2012 03:40 P  DocNo.:  [id number]  TA/        Referring Physician:  Sabino Gasser MD  [address]  [place], [address]        Primary Care Physician:  NO PCP PER PATIENT  [address] [place], [place] [address]      cc:

## 2013-11-30 ENCOUNTER — Inpatient Hospital Stay
Admission: EM | Admit: 2013-11-30 | Discharge: 2013-12-03 | DRG: 935 | Disposition: A | Discharge status: Home / Self Care | Payer: Medicaid Other | Attending: Critical Care Medicine | Admitting: Critical Care Medicine

## 2013-11-30 ENCOUNTER — Inpatient Hospital Stay (HOSPITAL_COMMUNITY): Payer: Medicaid Other | Admitting: Critical Care Medicine

## 2013-11-30 ENCOUNTER — Encounter (HOSPITAL_COMMUNITY): Payer: Self-pay

## 2013-11-30 ENCOUNTER — Emergency Department
Admission: EM | Admit: 2013-11-30 | Discharge: 2013-11-30 | Disposition: A | Discharge status: Still In Hospital | Payer: Self-pay | Attending: Emergency Medicine | Admitting: Emergency Medicine

## 2013-11-30 VITALS — BP 98/59 | HR 77 | Temp 98.0°F | Resp 20 | Ht 61.0 in | Wt 165.1 lb

## 2013-11-30 DIAGNOSIS — X19XXXA Contact with other heat and hot substances, initial encounter: Secondary | ICD-10-CM | POA: Diagnosis present

## 2013-11-30 DIAGNOSIS — Y9289 Other specified places as the place of occurrence of the external cause: Secondary | ICD-10-CM | POA: Insufficient documentation

## 2013-11-30 DIAGNOSIS — T300 Burn of unspecified body region, unspecified degree: Secondary | ICD-10-CM

## 2013-11-30 DIAGNOSIS — T25221A Burn of second degree of right foot, initial encounter: Secondary | ICD-10-CM

## 2013-11-30 DIAGNOSIS — I10 Essential (primary) hypertension: Secondary | ICD-10-CM | POA: Diagnosis present

## 2013-11-30 DIAGNOSIS — Y93E8 Activity, other personal hygiene: Secondary | ICD-10-CM | POA: Insufficient documentation

## 2013-11-30 DIAGNOSIS — T25019A Burn of unspecified degree of unspecified ankle, initial encounter: Secondary | ICD-10-CM

## 2013-11-30 DIAGNOSIS — T25229A Burn of second degree of unspecified foot, initial encounter: Principal | ICD-10-CM | POA: Diagnosis present

## 2013-11-30 DIAGNOSIS — Y92009 Unspecified place in unspecified non-institutional (private) residence as the place of occurrence of the external cause: Secondary | ICD-10-CM

## 2013-11-30 DIAGNOSIS — T25321A Burn of third degree of right foot, initial encounter: Secondary | ICD-10-CM

## 2013-11-30 DIAGNOSIS — T310 Burns involving less than 10% of body surface: Secondary | ICD-10-CM | POA: Diagnosis present

## 2013-11-30 DIAGNOSIS — T25219A Burn of second degree of unspecified ankle, initial encounter: Principal | ICD-10-CM | POA: Insufficient documentation

## 2013-11-30 HISTORY — DX: Essential (primary) hypertension: I10

## 2013-11-30 LAB — CBC WITH DIFF, BLOOD
ANC-Automated: 4.6 10*3/uL (ref 1.6–7.0)
Abs Eosinophils: 0.1 10*3/uL (ref 0.1–0.5)
Abs Lymphs: 2.6 10*3/uL (ref 0.8–3.1)
Abs Monos: 0.4 10*3/uL (ref 0.2–0.8)
Eosinophils: 1 % (ref 1–4)
Hct: 34 % (ref 34.0–45.0)
Hgb: 10.8 gm/dL — ABNORMAL LOW (ref 11.2–15.7)
Lymphocytes: 33 % (ref 19–53)
MCH: 25.5 pg — ABNORMAL LOW (ref 26.0–32.0)
MCHC: 31.8 % — ABNORMAL LOW (ref 32.0–36.0)
MCV: 80.2 um3 (ref 79.0–95.0)
MPV: 10 fL (ref 9.4–12.4)
Monocytes: 6 % (ref 5–12)
Plt Count: 308 10*3/uL (ref 140–370)
RBC: 4.24 10*6/uL (ref 3.90–5.20)
RDW: 14.6 % — ABNORMAL HIGH (ref 12.0–14.0)
Segs: 59 % (ref 34–71)
WBC: 7.7 10*3/uL (ref 4.0–10.0)

## 2013-11-30 LAB — PHOSPHORUS, BLOOD: Phosphorous: 3 mg/dL (ref 2.7–4.5)

## 2013-11-30 LAB — LIVER PANEL, BLOOD
ALT (SGPT): 20 U/L (ref 0–33)
AST (SGOT): 17 U/L (ref 0–32)
Albumin: 4.2 g/dL (ref 3.5–5.2)
Alkaline Phos: 77 U/L (ref 35–140)
Bilirubin, Dir: <0.2 mg/dL (ref <0.2)
Bilirubin, Tot: 0.18 mg/dL (ref <1.20)
Total Protein: 7.1 g/dL (ref 6.0–8.0)

## 2013-11-30 LAB — BASIC METABOLIC PANEL, BLOOD
Anion Gap: 11 mmol/L (ref 7–15)
BUN: 12 mg/dL (ref 6–20)
Bicarbonate: 23 mmol/L (ref 22–29)
Calcium: 8.8 mg/dL (ref 8.5–10.6)
Chloride: 102 mmol/L (ref 98–107)
Creatinine: 0.68 mg/dL (ref 0.51–0.95)
GFR: >60 mL/min
Glucose: 122 mg/dL — ABNORMAL HIGH (ref 70–115)
Potassium: 3.6 mmol/L (ref 3.5–5.1)
Sodium: 136 mmol/L (ref 136–145)

## 2013-11-30 LAB — GLUCOSE (POCT): Glucose (POCT): 99 mg/dL (ref 70–115)

## 2013-11-30 LAB — RETICULOCYTES AUTOMATED, BLOOD
Retic %, Auto: 1.3 % (ref 0.5–1.8)
Retic Count, Absolute: 57 10*3/uL (ref 16.0–78.0)

## 2013-11-30 LAB — MAGNESIUM, BLOOD: Magnesium: 2.1 mg/dL (ref 1.7–2.6)

## 2013-11-30 MED ORDER — SILVER SULFADIAZINE 1 % EX CREA
1.0000 | TOPICAL_CREAM | Freq: Every day | CUTANEOUS | Status: DC
Start: 2013-12-01 — End: 2013-12-03
  Administered 2013-12-01: 1 via TOPICAL

## 2013-11-30 MED ORDER — OXYCODONE HCL 5 MG OR TABS
15.0000 mg | ORAL_TABLET | ORAL | Status: DC | PRN
Start: 2013-11-30 — End: 2013-12-03
  Administered 2013-11-30 – 2013-12-02 (×7): 15 mg via ORAL
  Filled 2013-11-30 (×7): qty 1

## 2013-11-30 MED ORDER — COLLAGENASE 250 UNIT/GM EX OINT
TOPICAL_OINTMENT | CUTANEOUS | Status: DC
Start: 2013-11-30 — End: 2013-11-30
  Filled 2013-11-30: qty 30

## 2013-11-30 MED ORDER — SODIUM CHLORIDE 0.9 % IJ SOLN (CUSTOM)
3.0000 mL | INTRAMUSCULAR | Status: DC | PRN
Start: 2013-11-30 — End: 2013-12-03

## 2013-11-30 MED ORDER — HYDROMORPHONE HCL 1 MG/ML IJ SOLN
0.5000 mg | Freq: Every day | INTRAMUSCULAR | Status: DC | PRN
Start: 2013-11-30 — End: 2013-12-03

## 2013-11-30 MED ORDER — LISINOPRIL 20 MG OR TABS: 40.00 mg | ORAL_TABLET | Freq: Two times a day (BID) | ORAL | Status: AC

## 2013-11-30 MED ORDER — SILVER SULFADIAZINE 1 % EX CREA
1.00 | TOPICAL_CREAM | Freq: Once | CUTANEOUS | Status: AC | PRN
Start: 2013-11-30 — End: 2013-11-30
  Administered 2013-11-30: 1 via TOPICAL

## 2013-11-30 MED ORDER — SODIUM CHLORIDE 0.9 % IJ SOLN (CUSTOM)
3.0000 mL | Freq: Three times a day (TID) | INTRAMUSCULAR | Status: DC
Start: 2013-11-30 — End: 2013-12-03
  Administered 2013-11-30 – 2013-12-02 (×6): 3 mL via INTRAVENOUS

## 2013-11-30 MED ORDER — ENOXAPARIN SODIUM 30 MG/0.3ML SC SOLN
30.0000 mg | Freq: Two times a day (BID) | SUBCUTANEOUS | Status: DC
Start: 2013-11-30 — End: 2013-12-03
  Administered 2013-11-30 – 2013-12-03 (×6): 30 mg via SUBCUTANEOUS
  Filled 2013-11-30 (×6): qty 0.3

## 2013-11-30 MED ORDER — LISINOPRIL 40 MG OR TABS
40.0000 mg | ORAL_TABLET | Freq: Two times a day (BID) | ORAL | Status: DC
Start: 2013-11-30 — End: 2013-12-03
  Administered 2013-11-30 – 2013-12-02 (×4): 40 mg via ORAL
  Filled 2013-11-30 (×6): qty 1

## 2013-11-30 MED ORDER — METOPROLOL TARTRATE 5 MG/5ML IV SOLN
5.0000 mg | Freq: Four times a day (QID) | INTRAVENOUS | Status: DC | PRN
Start: 2013-11-30 — End: 2013-11-30

## 2013-11-30 MED ORDER — OXYCODONE HCL 10 MG OR TABS
10.0000 mg | ORAL_TABLET | ORAL | Status: DC | PRN
Start: 2013-11-30 — End: 2013-12-03
  Administered 2013-12-03: 10 mg via ORAL
  Filled 2013-11-30 (×2): qty 1
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
## 2013-11-30 NOTE — Patient Instructions (Signed)
You should plan on taking your pain medication about 30 minutes prior to wound care    Cleanse wound daily with soap and water.   Be sure to use enough force to remove any build up of old dressing material.  Apply silvadene ( the white stuff) and place it directly onto the wound.  Cover with a light layer of burn padding.  Wrap with kerlix.  Cover with stretch netting.    If you have any questions please call us at [phone]    [facility] Center IncHANK YOU AND FEEL BETTER SOON!!!!!!!!!!!
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
## 2013-12-01 DIAGNOSIS — T25329A Burn of third degree of unspecified foot, initial encounter: Secondary | ICD-10-CM

## 2013-12-01 DIAGNOSIS — T25229A Burn of second degree of unspecified foot, initial encounter: Principal | ICD-10-CM

## 2013-12-01 LAB — GLYCOSYLATED HGB(A1C), BLOOD: Glyco Hgb (A1C): 5.8 % (ref 4.8–5.9)

## 2013-12-01 LAB — IBC - IRON BINDING CAPACITY
Iron Saturation: 10 %
Total IBC: 453 ug/dL (ref 148–506)
UIBC: 408 ug/dL — ABNORMAL HIGH (ref 112–346)

## 2013-12-01 LAB — IRON, BLOOD: Iron: 45 ug/dL (ref 37–145)

## 2013-12-01 LAB — FERRITIN, BLOOD: Ferritin: 6 ng/mL — ABNORMAL LOW (ref 13–150)

## 2013-12-01 LAB — PREALBUMIN (TRANSTHYRETIN), BLOOD: Prealbumin: 24 mg/dL (ref 20–40)

## 2013-12-01 MED ORDER — ONDANSETRON HCL 4 MG/2ML IV SOLN
4.0000 mg | Freq: Four times a day (QID) | INTRAMUSCULAR | Status: DC | PRN
Start: 2013-12-01 — End: 2013-12-03
  Administered 2013-12-01 – 2013-12-02 (×4): 4 mg via INTRAVENOUS
  Filled 2013-12-01 (×5): qty 2
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
## 2013-12-01 NOTE — Plan of Care (Signed)
Problem: Pain Related to Wound Care  Goal: Communication of presence of pain  Outcome: Met  VU of pain scale.  Verbalizes when requires pain meds.  Some nausea 1.5 hr post oxy.  Will reassess with next dose. Good pain control with oxy    Problem: Falls - Risk of  Goal: Absence of falls  Outcome: Met  No falls this shift. Safety precautions in place. Bed in low position, siderails up x 2 call light in reach, wheels locked, nonskid footwear.  AMb easily    Problem: Thromboemboli, Risk of  Goal: Absence of signs and symptoms of Thromboemboli  Outcome: Met  No signs and symptoms of thromboemboli. Pt with no swelling or redness in extremities and positive pulses. Patient receiving anticoagulation therapy as ordered. Continues to receive weekly doppler studies per protocol.   Education done re anticoag therapy.  VU of same.
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
## 2013-12-02 LAB — MRSA SURVEILLANCE CULTURE

## 2013-12-02 MED ORDER — VITAMIN C 500 MG OR TABS
500.0000 mg | ORAL_TABLET | Freq: Every day | ORAL | Status: DC
Start: 2013-12-02 — End: 2013-12-03
  Administered 2013-12-02 – 2013-12-03 (×2): 500 mg via ORAL
  Filled 2013-12-02 (×2): qty 1

## 2013-12-02 MED ORDER — THERA M PLUS OR TABS
1.0000 | ORAL_TABLET | Freq: Every day | ORAL | Status: DC
Start: 2013-12-02 — End: 2013-12-03
  Administered 2013-12-02 – 2013-12-03 (×2): 1 via ORAL
  Filled 2013-12-02 (×2): qty 1

## 2013-12-02 MED ORDER — COLLAGENASE 250 UNIT/GM EX OINT
1.0000 | TOPICAL_OINTMENT | Freq: Every day | CUTANEOUS | Status: DC
Start: 2013-12-02 — End: 2013-12-03
  Administered 2013-12-02 – 2013-12-03 (×2): 1 via TOPICAL
  Filled 2013-12-02: qty 30

## 2013-12-02 NOTE — Interdisciplinary (Addendum)
Notified [id number] that pt Brandy Martin 515B does not have a urine HCG since admission. Notified Fa Tangtumnu NP this AM that pt did not tolerate PT as pt c/o being light headed / dizzy and PT placed pt back to bed safe and sound after ambulating 3 feet.
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
## 2013-12-03 LAB — URINE HCG: HCG, Urine: NEGATIVE

## 2013-12-03 MED ORDER — OXYCODONE-ACETAMINOPHEN 5-325 MG OR TABS
1.00 | ORAL_TABLET | Freq: Four times a day (QID) | ORAL | Status: AC | PRN
Start: 2013-12-03 — End: ?

## 2013-12-03 MED ORDER — IBUPROFEN 400 MG OR TABS
400.00 mg | ORAL_TABLET | Freq: Three times a day (TID) | ORAL | Status: AC | PRN
Start: 2013-12-03 — End: ?

## 2013-12-03 MED ORDER — DOCUSATE SODIUM 250 MG OR CAPS
250.00 mg | ORAL_CAPSULE | Freq: Every day | ORAL | Status: AC
Start: 2013-12-03 — End: ?

## 2013-12-03 MED ORDER — COLLAGENASE 250 UNIT/GM EX OINT
1.00 | TOPICAL_OINTMENT | Freq: Every day | CUTANEOUS | Status: AC
Start: 2013-12-03 — End: ?
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
## 2013-12-03 NOTE — Interdisciplinary (Signed)
FWW to be issued at the bedside. No further CM needs for discharge. RTC as directed.Myrtie Soman/JVC

## 2013-12-10 ENCOUNTER — Ambulatory Visit
Admission: RE | Admit: 2013-12-10 | Discharge: 2013-12-10 | Disposition: A | Discharge status: Home / Self Care | Payer: Self-pay | Source: Ambulatory Visit | Attending: Nurse Practitioner | Admitting: Nurse Practitioner

## 2013-12-10 VITALS — BP 135/81 | HR 94 | Temp 98.7°F | Resp 18 | Wt 165.0 lb

## 2013-12-10 DIAGNOSIS — Z5189 Encounter for other specified aftercare: Principal | ICD-10-CM | POA: Insufficient documentation

## 2013-12-10 DIAGNOSIS — X19XXXD Contact with other heat and hot substances, subsequent encounter: Secondary | ICD-10-CM

## 2013-12-10 DIAGNOSIS — T25221A Burn of second degree of right foot, initial encounter: Secondary | ICD-10-CM

## 2013-12-10 DIAGNOSIS — T25229A Burn of second degree of unspecified foot, initial encounter: Secondary | ICD-10-CM | POA: Insufficient documentation

## 2013-12-10 MED ORDER — COLLAGENASE 250 UNIT/GM EX OINT
1.0000 | TOPICAL_OINTMENT | Freq: Once | CUTANEOUS | Status: DC | PRN
Start: 2013-12-10 — End: 2013-12-10

## 2013-12-10 NOTE — Patient Instructions (Signed)
1. Clean burn wound with mild soap and water once daily.  2. Apply Santyl ointment to burn wound once daily and covered with Xeroform gauze dressing.    If any questions or concerns that you may have please contact burn practitioner at [phone].
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
## 2013-12-20 ENCOUNTER — Ambulatory Visit
Admission: RE | Admit: 2013-12-20 | Discharge: 2013-12-20 | Disposition: A | Discharge status: Home / Self Care | Payer: Self-pay | Source: Ambulatory Visit | Attending: Physician Assistant | Admitting: Physician Assistant

## 2013-12-20 DIAGNOSIS — T25229A Burn of second degree of unspecified foot, initial encounter: Secondary | ICD-10-CM

## 2013-12-20 DIAGNOSIS — T25019A Burn of unspecified degree of unspecified ankle, initial encounter: Secondary | ICD-10-CM

## 2013-12-20 DIAGNOSIS — T300 Burn of unspecified body region, unspecified degree: Principal | ICD-10-CM | POA: Insufficient documentation
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
## 2013-12-20 NOTE — Addendum Note (Signed)
Encounter addended by: Drucie Ip, RN on: 12/20/2013  6:26 PM<BR>     Documentation filed: Scan
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
